# Patient Record
Sex: Male | Born: 1985 | Race: White | Hispanic: No | Marital: Single | Smoking: Current every day smoker
Health system: Southern US, Community
[De-identification: ages and names within clinical notes are randomized; demographics above are authoritative.]

---
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
## 2011-11-03 ENCOUNTER — Emergency Department (HOSPITAL_COMMUNITY)
Admission: EM | Admit: 2011-11-03 | Discharge: 2011-11-03 | Disposition: A | Discharge status: Home / Self Care | Payer: Self-pay | Attending: Emergency Medicine | Admitting: Emergency Medicine

## 2011-11-03 ENCOUNTER — Other Ambulatory Visit: Payer: Self-pay

## 2011-11-03 ENCOUNTER — Encounter (HOSPITAL_COMMUNITY): Payer: Self-pay | Admitting: *Deleted

## 2011-11-03 DIAGNOSIS — R404 Transient alteration of awareness: Secondary | ICD-10-CM | POA: Insufficient documentation

## 2011-11-03 DIAGNOSIS — J45909 Unspecified asthma, uncomplicated: Secondary | ICD-10-CM | POA: Insufficient documentation

## 2011-11-03 DIAGNOSIS — R0789 Other chest pain: Secondary | ICD-10-CM | POA: Insufficient documentation

## 2011-11-03 DIAGNOSIS — R002 Palpitations: Secondary | ICD-10-CM | POA: Insufficient documentation

## 2011-11-03 DIAGNOSIS — F410 Panic disorder [episodic paroxysmal anxiety] without agoraphobia: Secondary | ICD-10-CM | POA: Insufficient documentation

## 2011-11-03 DIAGNOSIS — F411 Generalized anxiety disorder: Secondary | ICD-10-CM | POA: Insufficient documentation

## 2011-11-03 MED ORDER — LORAZEPAM 1 MG PO TABS
1.0000 mg | ORAL_TABLET | Freq: Once | ORAL | Status: AC
  Administered 2011-11-03: 1 mg via ORAL
  Filled 2011-11-03: qty 1

## 2011-11-03 MED ORDER — LORAZEPAM 1 MG PO TABS: 0.5000 mg | ORAL_TABLET | Freq: Three times a day (TID) | ORAL | Status: AC | PRN

## 2011-11-03 NOTE — ED Notes (Signed)
ZOX:[id number]<AV> Expected date:11/03/11<BR> Expected time:11:39 AM<BR> Means of arrival:Ambulance<BR> Comments:<BR> EMS 1 GC - anxiety/chestwall pain

## 2011-11-03 NOTE — Discharge Instructions (Signed)
Your EKG today was normal. You've been given a prescription for a small amount of medication; take half a pill for anxiety. Please consider making a follow up with one of the behavioral health resources listed on the attached sheet. If you are having additional chest pain, passing out spells, or other worrisome symptoms, please return to the ER.  RESOURCE GUIDE  Dental Problems  Patients with Medicaid: [facility] [phone] [address]                                           [phone] [address] Phone:  [phone]                                                  Phone:  [phone]  If unable to pay or uninsured, contact:  [facility] or [facility] to become qualified for the adult dental clinic.  Chronic Pain Problems Contact [facility]  [phone] Patients need to be referred by their primary care doctor.  Insufficient Money for Medicine Contact United Way:  call "211" or [facility] 463-222-9969.  No Primary Care Doctor Call [facility]  [phone] Other agencies that provide inexpensive medical care    [facility]  [phone]    [facility]  [phone]    [facility]  ([phone]    [facility]  [phone]    [facility]  ([phone]    [facility]  [phone]  Psychological Services [facility]  [phone] [facility] Services  [phone] [facility]   [phone] (emergency services [phone])  Substance Abuse Resources [facility]  [phone] [facility] 818-307-8839 [facility] [place] [phone] Floydene Flock [phone] Residential & Outpatient Substance Abuse Program  [phone]  Abuse/Neglect [facility] Child Abuse Hotline 830-853-2022 [facility] Child Abuse Hotline [phone] (After Hours)  Emergency Shelter [facility] (223)066-2069  [facility] [place] [facility] [place] ([phone] [facility] [phone]  MRSA Hotline #:   412-699-8072    [facility] Resources  [facility] [facility]     [employer]                          [facility] Dept. [address] [place]                       [address]      [address]  [place]                                                Cristobal Goldmann Phone:  [phone]  Phone:  [phone]                 Phone:  [phone]  [facility] Mental [facility] Phone:  [phone]  [facility] Child Abuse Hotline 630-390-2053 445-747-2770 (After Hours)  Anxiety and Panic Attacks Your caregiver has informed you that you are having an anxiety or panic attack. There may be many forms of this. Most of the time these attacks come suddenly and without warning. They come at any time of day, including periods of sleep, and at any time of life. They may be strong and unexplained. Although panic attacks are very scary, they are physically harmless. Sometimes the cause of your anxiety is not known. Anxiety is a protective mechanism of the body in its fight or flight mechanism. Most of these perceived danger situations are actually nonphysical situations (such as anxiety over losing a job). CAUSES  The causes of an anxiety or panic attack are many. Panic attacks may occur in otherwise healthy people given a certain set of circumstances. There may be a genetic cause for panic attacks. Some medications may also have anxiety as a side effect. SYMPTOMS  Some of the most common feelings are:  Intense terror.   Dizziness, feeling faint.   Hot and cold flashes.   Fear of going crazy.   Feelings that nothing is real.   Sweating.   Shaking.   Chest pain or a fast heartbeat (palpitations).   Smothering, choking sensations.   Feelings of  impending doom and that death is near.   Tingling of extremities, this may be from over-breathing.   Altered reality (derealization).   Being detached from yourself (depersonalization).  Several symptoms can be present to make up anxiety or panic attacks. DIAGNOSIS  The evaluation by your caregiver will depend on the type of symptoms you are experiencing. The diagnosis of anxiety or panic attack is made when no physical illness can be determined to be a cause of the symptoms. TREATMENT  Treatment to prevent anxiety and panic attacks may include:  Avoidance of circumstances that cause anxiety.   Reassurance and relaxation.   Regular exercise.   Relaxation therapies, such as yoga.   Psychotherapy with a psychiatrist or therapist.   Avoidance of caffeine, alcohol and illegal drugs.   Prescribed medication.  SEEK IMMEDIATE MEDICAL CARE IF:   You experience panic attack symptoms that are different than your usual symptoms.   You have any worsening or concerning symptoms.  Document Released: 08/21/2005 Document Revised: 05/03/2011 Document Reviewed: 12/23/2009 [facility] Patient Information 2012 [place], [place].

## 2011-11-03 NOTE — ED Notes (Addendum)
Urine collected and sent down to lab for keeping. No order for testing at this time. RN notified 
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
## 2011-11-04 NOTE — ED Provider Notes (Signed)
Medical screening examination/treatment/procedure(s) were performed by non-physician practitioner and as supervising physician I was immediately available for consultation/collaboration.   Monna Crean, MD 11/04/11 0909 

## 2014-02-03 ENCOUNTER — Encounter (HOSPITAL_COMMUNITY): Payer: Self-pay | Admitting: Emergency Medicine

## 2014-02-03 ENCOUNTER — Emergency Department (HOSPITAL_COMMUNITY)
Admission: EM | Admit: 2014-02-03 | Discharge: 2014-02-03 | Disposition: A | Discharge status: Other Acute Inpatient Hospital | Payer: Self-pay | Attending: Emergency Medicine | Admitting: Emergency Medicine

## 2014-02-03 DIAGNOSIS — T25229A Burn of second degree of unspecified foot, initial encounter: Secondary | ICD-10-CM | POA: Insufficient documentation

## 2014-02-03 DIAGNOSIS — T3111 Burns involving 10-19% of body surface with 10-19% third degree burns: Secondary | ICD-10-CM | POA: Insufficient documentation

## 2014-02-03 DIAGNOSIS — Y92009 Unspecified place in unspecified non-institutional (private) residence as the place of occurrence of the external cause: Secondary | ICD-10-CM | POA: Insufficient documentation

## 2014-02-03 DIAGNOSIS — X19XXXA Contact with other heat and hot substances, initial encounter: Secondary | ICD-10-CM | POA: Insufficient documentation

## 2014-02-03 DIAGNOSIS — T3110 Burns involving 10-19% of body surface with 0% to 9% third degree burns: Secondary | ICD-10-CM | POA: Insufficient documentation

## 2014-02-03 DIAGNOSIS — J45909 Unspecified asthma, uncomplicated: Secondary | ICD-10-CM | POA: Insufficient documentation

## 2014-02-03 DIAGNOSIS — T2124XA Burn of second degree of lower back, initial encounter: Secondary | ICD-10-CM | POA: Insufficient documentation

## 2014-02-03 DIAGNOSIS — Y9389 Activity, other specified: Secondary | ICD-10-CM | POA: Insufficient documentation

## 2014-02-03 DIAGNOSIS — Z79899 Other long term (current) drug therapy: Secondary | ICD-10-CM | POA: Insufficient documentation

## 2014-02-03 DIAGNOSIS — T23209A Burn of second degree of unspecified hand, unspecified site, initial encounter: Secondary | ICD-10-CM | POA: Insufficient documentation

## 2014-02-03 DIAGNOSIS — T23009A Burn of unspecified degree of unspecified hand, unspecified site, initial encounter: Secondary | ICD-10-CM

## 2014-02-03 DIAGNOSIS — Z23 Encounter for immunization: Secondary | ICD-10-CM | POA: Insufficient documentation

## 2014-02-03 DIAGNOSIS — T25221A Burn of second degree of right foot, initial encounter: Secondary | ICD-10-CM

## 2014-02-03 DIAGNOSIS — T24239A Burn of second degree of unspecified lower leg, initial encounter: Secondary | ICD-10-CM | POA: Insufficient documentation

## 2014-02-03 DIAGNOSIS — R000 Tachycardia, unspecified: Secondary | ICD-10-CM | POA: Insufficient documentation

## 2014-02-03 DIAGNOSIS — T22219A Burn of second degree of unspecified forearm, initial encounter: Secondary | ICD-10-CM | POA: Insufficient documentation

## 2014-02-03 LAB — CBC WITH DIFFERENTIAL/PLATELET
BASOS PCT: 1 % (ref 0–1)
Basophils Absolute: 0.1 10*3/uL (ref 0.0–0.1)
EOS ABS: 0.2 10*3/uL (ref 0.0–0.7)
EOS PCT: 3 % (ref 0–5)
HCT: 46 % (ref 39.0–52.0)
HEMOGLOBIN: 16 g/dL (ref 13.0–17.0)
Lymphocytes Relative: 26 % (ref 12–46)
Lymphs Abs: 1.8 10*3/uL (ref 0.7–4.0)
MCH: 30.6 pg (ref 26.0–34.0)
MCHC: 34.8 g/dL (ref 30.0–36.0)
MCV: 88 fL (ref 78.0–100.0)
MONOS PCT: 8 % (ref 3–12)
Monocytes Absolute: 0.6 10*3/uL (ref 0.1–1.0)
NEUTROS PCT: 62 % (ref 43–77)
Neutro Abs: 4.2 10*3/uL (ref 1.7–7.7)
PLATELETS: 238 10*3/uL (ref 150–400)
RBC: 5.23 MIL/uL (ref 4.22–5.81)
RDW: 12.8 % (ref 11.5–15.5)
WBC: 6.9 10*3/uL (ref 4.0–10.5)

## 2014-02-03 LAB — COMPREHENSIVE METABOLIC PANEL
ALBUMIN: 4.3 g/dL (ref 3.5–5.2)
ALK PHOS: 61 U/L (ref 39–117)
ALT: 11 U/L (ref 0–53)
AST: 19 U/L (ref 0–37)
BUN: 14 mg/dL (ref 6–23)
CALCIUM: 9.7 mg/dL (ref 8.4–10.5)
CO2: 23 mEq/L (ref 19–32)
Chloride: 100 mEq/L (ref 96–112)
Creatinine, Ser: 1.18 mg/dL (ref 0.50–1.35)
GFR calc non Af Amer: 85 mL/min — ABNORMAL LOW (ref >90)
GLUCOSE: 152 mg/dL — AB (ref 70–99)
POTASSIUM: 3.8 meq/L (ref 3.7–5.3)
Sodium: 139 mEq/L (ref 137–147)
TOTAL PROTEIN: 7.4 g/dL (ref 6.0–8.3)
Total Bilirubin: 2.1 mg/dL — ABNORMAL HIGH (ref 0.3–1.2)

## 2014-02-03 MED ORDER — ONDANSETRON HCL 4 MG/2ML IJ SOLN
4.0000 mg | Freq: Once | INTRAMUSCULAR | Status: AC
  Administered 2014-02-03: 4 mg via INTRAVENOUS

## 2014-02-03 MED ORDER — TETANUS-DIPHTH-ACELL PERTUSSIS 5-2.5-18.5 LF-MCG/0.5 IM SUSP
0.5000 mL | Freq: Once | INTRAMUSCULAR | Status: AC
  Administered 2014-02-03: 0.5 mL via INTRAMUSCULAR
  Filled 2014-02-03: qty 0.5

## 2014-02-03 MED ORDER — LACTATED RINGERS IV SOLN
INTRAVENOUS | Status: DC
  Administered 2014-02-03: 13:00:00 via INTRAVENOUS

## 2014-02-03 MED ORDER — HYDROMORPHONE HCL PF 1 MG/ML IJ SOLN
1.0000 mg | Freq: Once | INTRAMUSCULAR | Status: AC
  Administered 2014-02-03: 1 mg via INTRAVENOUS
  Filled 2014-02-03: qty 1

## 2014-02-03 MED ORDER — ONDANSETRON HCL 4 MG/2ML IJ SOLN
INTRAMUSCULAR | Status: AC
  Filled 2014-02-03: qty 2

## 2014-02-03 MED ORDER — HYDROMORPHONE HCL PF 1 MG/ML IJ SOLN
INTRAMUSCULAR | Status: AC
  Administered 2014-02-03: 1 mg
  Filled 2014-02-03: qty 1

## 2014-02-03 MED ORDER — SODIUM CHLORIDE 0.9 % IV BOLUS (SEPSIS): 2000.0000 mL | Freq: Once | INTRAVENOUS | Status: DC

## 2014-02-03 MED ORDER — MORPHINE SULFATE 4 MG/ML IJ SOLN
8.0000 mg | Freq: Once | INTRAMUSCULAR | Status: AC
  Administered 2014-02-03: 8 mg via INTRAVENOUS
  Filled 2014-02-03: qty 2

## 2014-02-03 MED ORDER — LORAZEPAM 2 MG/ML IJ SOLN
1.0000 mg | Freq: Once | INTRAMUSCULAR | Status: AC
  Administered 2014-02-03: 1 mg via INTRAVENOUS
  Filled 2014-02-03: qty 1

## 2014-02-03 MED ORDER — FENTANYL CITRATE 0.05 MG/ML IJ SOLN
100.0000 ug | Freq: Once | INTRAMUSCULAR | Status: AC
  Administered 2014-02-03: 100 ug via INTRAVENOUS
  Filled 2014-02-03: qty 2

## 2014-02-03 MED ORDER — DIAZEPAM 5 MG/ML IJ SOLN
5.0000 mg | Freq: Once | INTRAMUSCULAR | Status: AC
  Administered 2014-02-03: 5 mg via INTRAVENOUS
  Filled 2014-02-03: qty 2

## 2014-02-03 MED ORDER — SODIUM CHLORIDE 0.9 % IV BOLUS (SEPSIS)
1000.0000 mL | Freq: Once | INTRAVENOUS | Status: AC
  Administered 2014-02-03: 1000 mL via INTRAVENOUS
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
## 2014-02-03 NOTE — Progress Notes (Signed)
[facility] did not see patient but will be sending information about [facility] Orange Card program, using the address provided.  °
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
## 2014-02-14 ENCOUNTER — Emergency Department (HOSPITAL_COMMUNITY)
Admission: EM | Admit: 2014-02-14 | Discharge: 2014-02-14 | Disposition: A | Discharge status: Home / Self Care | Payer: Self-pay | Attending: Emergency Medicine | Admitting: Emergency Medicine

## 2014-02-14 ENCOUNTER — Encounter (HOSPITAL_COMMUNITY): Payer: Self-pay | Admitting: Emergency Medicine

## 2014-02-14 DIAGNOSIS — Z945 Skin transplant status: Secondary | ICD-10-CM

## 2014-02-14 DIAGNOSIS — J45909 Unspecified asthma, uncomplicated: Secondary | ICD-10-CM | POA: Insufficient documentation

## 2014-02-14 DIAGNOSIS — R209 Unspecified disturbances of skin sensation: Secondary | ICD-10-CM | POA: Insufficient documentation

## 2014-02-14 DIAGNOSIS — T24031A Burn of unspecified degree of right lower leg, initial encounter: Secondary | ICD-10-CM

## 2014-02-14 DIAGNOSIS — F17200 Nicotine dependence, unspecified, uncomplicated: Secondary | ICD-10-CM | POA: Insufficient documentation

## 2014-02-14 DIAGNOSIS — Z9889 Other specified postprocedural states: Secondary | ICD-10-CM | POA: Insufficient documentation

## 2014-02-14 NOTE — Discharge Instructions (Signed)
1. Medications: usual home medications 2. Treatment: rest, drink plenty of fluids, wash with warm soap and water 2 times per day 3. Follow Up: Please call the burn center at the above 24/7 phone number for discussion with the physician   Burn Care Your skin is a natural barrier to infection. It is the largest organ of your body. Burns damage this natural protection. To help prevent infection, it is very important to follow your caregiver's instructions in the care of your burn. Burns are classified as:  First degree. There is only redness of the skin (erythema). No scarring is expected.  Second degree. There is blistering of the skin. Scarring may occur with deeper burns.  Third degree. All layers of the skin are injured, and scarring is expected. HOME CARE INSTRUCTIONS   Wash your hands well before changing your bandage.  Change your bandage as often as directed by your caregiver.  Remove the old bandage. If the bandage sticks, you may soak it off with cool, clean water.  Cleanse the burn thoroughly but gently with mild soap and water.  Pat the area dry with a clean, dry cloth.  Apply a thin layer of antibacterial cream to the burn.  Apply a clean bandage as instructed by your caregiver.  Keep the bandage as clean and dry as possible.  Elevate the affected area for the first 24 hours, then as instructed by your caregiver.  Only take over-the-counter or prescription medicines for pain, discomfort, or fever as directed by your caregiver. SEEK IMMEDIATE MEDICAL CARE IF:   You develop excessive pain.  You develop redness, tenderness, swelling, or red streaks near the burn.  The burned area develops yellowish-white fluid (pus) or a bad smell.  You have a fever. MAKE SURE YOU:   Understand these instructions.  Will watch your condition.  Will get help right away if you are not doing well or get worse. Document Released: 08/21/2005 Document Revised: 11/13/2011 Document  Reviewed: 01/11/2011 [facility] Patient Information 2014 [place].
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
## 2023-02-08 ENCOUNTER — Other Ambulatory Visit: Payer: Self-pay | Admitting: Internal Medicine

## 2023-02-09 LAB — COMPLETE METABOLIC PANEL WITH GFR
AG Ratio: 1.8 (calc) (ref 1.0–2.5)
ALT: 17 U/L (ref 9–46)
AST: 25 U/L (ref 10–40)
Albumin: 4.5 g/dL (ref 3.6–5.1)
Alkaline phosphatase (APISO): 60 U/L (ref 36–130)
BUN: 16 mg/dL (ref 7–25)
CO2: 21 mmol/L (ref 20–32)
Calcium: 9.3 mg/dL (ref 8.6–10.3)
Chloride: 105 mmol/L (ref 98–110)
Creat: 1.05 mg/dL (ref 0.60–1.26)
Globulin: 2.5 g/dL (calc) (ref 1.9–3.7)
Glucose, Bld: 75 mg/dL (ref 65–99)
Potassium: 4 mmol/L (ref 3.5–5.3)
Sodium: 138 mmol/L (ref 135–146)
Total Bilirubin: 0.8 mg/dL (ref 0.2–1.2)
Total Protein: 7 g/dL (ref 6.1–8.1)
eGFR: 96 mL/min/{1.73_m2} (ref >=60)

## 2023-02-09 LAB — CBC
HCT: 43 % (ref 38.5–50.0)
Hemoglobin: 14.5 g/dL (ref 13.2–17.1)
MCH: 32.1 pg (ref 27.0–33.0)
MCHC: 33.7 g/dL (ref 32.0–36.0)
MCV: 95.1 fL (ref 80.0–100.0)
MPV: 9.8 fL (ref 7.5–12.5)
Platelets: 300 10*3/uL (ref 140–400)
RBC: 4.52 10*6/uL (ref 4.20–5.80)
RDW: 12.1 % (ref 11.0–15.0)
WBC: 7.6 10*3/uL (ref 3.8–10.8)

## 2023-02-09 LAB — LIPID PANEL
Cholesterol: 122 mg/dL (ref <200)
HDL: 51 mg/dL (ref >=40)
LDL Cholesterol (Calc): 51 mg/dL (calc)
Non-HDL Cholesterol (Calc): 71 mg/dL (calc) (ref <130)
Total CHOL/HDL Ratio: 2.4 (calc) (ref <5.0)
Triglycerides: 114 mg/dL (ref <150)

## 2023-02-09 LAB — VITAMIN D 25 HYDROXY (VIT D DEFICIENCY, FRACTURES): Vit D, 25-Hydroxy: 40 ng/mL (ref 30–100)

## 2023-03-16 ENCOUNTER — Emergency Department (HOSPITAL_BASED_OUTPATIENT_CLINIC_OR_DEPARTMENT_OTHER)
Admission: EM | Admit: 2023-03-16 | Discharge: 2023-03-16 | Disposition: A | Discharge status: Home / Self Care | Payer: Medicaid Other | Attending: Emergency Medicine | Admitting: Emergency Medicine

## 2023-03-16 ENCOUNTER — Encounter (HOSPITAL_BASED_OUTPATIENT_CLINIC_OR_DEPARTMENT_OTHER): Payer: Self-pay

## 2023-03-16 ENCOUNTER — Other Ambulatory Visit: Payer: Self-pay

## 2023-03-16 DIAGNOSIS — N489 Disorder of penis, unspecified: Secondary | ICD-10-CM | POA: Insufficient documentation

## 2023-03-16 DIAGNOSIS — J45909 Unspecified asthma, uncomplicated: Secondary | ICD-10-CM | POA: Diagnosis not present

## 2023-03-16 DIAGNOSIS — F17210 Nicotine dependence, cigarettes, uncomplicated: Secondary | ICD-10-CM | POA: Diagnosis not present

## 2023-03-16 DIAGNOSIS — L989 Disorder of the skin and subcutaneous tissue, unspecified: Secondary | ICD-10-CM | POA: Diagnosis present

## 2023-03-16 NOTE — ED Triage Notes (Signed)
POV from home, A&O x 4, GCS 15, amb to triage  Pt c/o bump on penis that has been there for years, began to pick at it, now has opening on it. Pt denies pain, no redness or drainage from site.

## 2023-03-16 NOTE — Discharge Instructions (Signed)
You were evaluated in the Emergency Department and after careful evaluation, we did not find any emergent condition requiring admission or further testing in the hospital.  Your exam/testing today was overall reassuring.  Symptoms seem to be due to a sebaceous cyst or small ingrown hair.  Commend warm compresses and following up with a dermatologist.  Please return to the Emergency Department if you experience any worsening of your condition.  Thank you for allowing [place] to be a part of your care.

## 2023-03-16 NOTE — ED Provider Notes (Signed)
[facility] Emergency Department Provider Note MRN:  [id number]  Arrival date & time: 03/16/23     Chief Complaint   Skin Problem   History of Present Illness   Christopher Braun is a 37 y.o. year-old male with no pertinent past medical history presenting to the ED with chief complaint of skin problem.  Small bump on the underside of his penis has been there for a few years, wanted it checked out.  Squeezed it and thinks something is coming out.  Review of Systems  A thorough review of systems was obtained and all systems are negative except as noted in the HPI and PMH.   Patient's Health History    Past Medical History:  Diagnosis Date   Asthma     History reviewed. No pertinent surgical history.  History reviewed. No pertinent family history.  Social History   Socioeconomic History   Marital status: Single    Spouse name: Not on file   Number of children: Not on file   Years of education: Not on file   Highest education level: Not on file  Occupational History   Not on file  Tobacco Use   Smoking status: Every Day    Types: Cigarettes   Smokeless tobacco: Never  Substance and Sexual Activity   Alcohol use: Yes    Comment: occasion   Drug use: Yes    Types: Marijuana   Sexual activity: Not on file  Other Topics Concern   Not on file  Social History Narrative   Not on file   Social Determinants of Health   Financial Resource Strain: Not on file  Food Insecurity: Not on file  Transportation Needs: Not on file  Physical Activity: Not on file  Stress: Not on file  Social Connections: Not on file  Intimate Partner Violence: Not on file     Physical Exam   Vitals:   03/16/23 0525  BP: 138/89  Pulse: 76  Resp: 18  Temp: 98 F (36.7 C)  SpO2: 98%    CONSTITUTIONAL: Well-appearing, NAD NEURO/PSYCH:  Alert and oriented x 3, no focal deficits EYES:  eyes equal and reactive ENT/NECK:  no LAD, no JVD CARDIO: Regular rate,  well-perfused, normal S1 and S2 PULM:  CTAB no wheezing or rhonchi GI/GU:  non-distended, non-tender MSK/SPINE:  No gross deformities, no edema SKIN:  no rash   *Additional and/or pertinent findings included in MDM below  Diagnostic and Interventional Summary    EKG Interpretation Date/Time:    Ventricular Rate:    PR Interval:    QRS Duration:    QT Interval:    QTC Calculation:   R Axis:      Text Interpretation:         Labs Reviewed - No data to display  No orders to display    Medications - No data to display   Procedures  /  Critical Care Procedures  ED Course and Medical Decision Making  Initial Impression and Ddx On exam there is a very small nodule on the ventral side of the shaft of the penis that appears to be a sebaceous cyst.  There is a tiny opening and a blackhead protruding and so suspect that he was trying to push out the contents.  This is not an emergency.  He has no other complaints.  Past medical/surgical history that increases complexity of ED encounter: None  Interpretation of Diagnostics Laboratory and/or imaging options to aid in the diagnosis/care of the  patient were considered.  After careful history and physical examination, it was determined that there was no indication for diagnostics at this time.  Patient Reassessment and Ultimate Disposition/Management     Discharge  Patient management required discussion with the following services or consulting groups:  None  Complexity of Problems Addressed Acute complicated illness or Injury  Additional Data Reviewed and Analyzed Further history obtained from: None  Additional Factors Impacting ED Encounter Risk None  Elmer Sow. Pilar Plate, MD [facility] Emergency Medicine [facility] [email or web address]  Final Clinical Impressions(s) / ED Diagnoses     ICD-10-CM   1. Penile lesion  N48.9       ED Discharge Orders     None        Discharge Instructions  Discussed with and Provided to Patient:    Discharge Instructions      You were evaluated in the Emergency Department and after careful evaluation, we did not find any emergent condition requiring admission or further testing in the hospital.  Your exam/testing today was overall reassuring.  Symptoms seem to be due to a sebaceous cyst or small ingrown hair.  Commend warm compresses and following up with a dermatologist.  Please return to the Emergency Department if you experience any worsening of your condition.  Thank you for allowing [place] to be a part of your care.       Sabas Sous, MD 03/16/23 [phone]

## 2023-03-16 NOTE — ED Notes (Signed)
Reviewed AVS with patient, patient expressed understanding of directions, denies further questions at this time.
